# Patient Record
Sex: Male | Born: 1990 | Race: Black or African American | Hispanic: No | Marital: Single | Smoking: Current some day smoker
Health system: Southern US, Community
[De-identification: ages and names within clinical notes are randomized; demographics above are authoritative.]

## PROBLEM LIST (undated history)

## (undated) HISTORY — PX: SHOULDER SURGERY: SHX246

## (undated) HISTORY — PX: MANDIBLE FRACTURE SURGERY: SHX706

---

## 2016-08-22 ENCOUNTER — Emergency Department (HOSPITAL_COMMUNITY)
Admission: EM | Admit: 2016-08-22 | Discharge: 2016-08-23 | Disposition: A | Discharge status: Home / Self Care | Payer: Self-pay | Attending: Emergency Medicine | Admitting: Emergency Medicine

## 2016-08-22 ENCOUNTER — Encounter (HOSPITAL_COMMUNITY): Payer: Self-pay

## 2016-08-22 DIAGNOSIS — R200 Anesthesia of skin: Secondary | ICD-10-CM | POA: Insufficient documentation

## 2016-08-22 DIAGNOSIS — Y929 Unspecified place or not applicable: Secondary | ICD-10-CM | POA: Insufficient documentation

## 2016-08-22 DIAGNOSIS — Y939 Activity, unspecified: Secondary | ICD-10-CM | POA: Insufficient documentation

## 2016-08-22 DIAGNOSIS — Y999 Unspecified external cause status: Secondary | ICD-10-CM | POA: Insufficient documentation

## 2016-08-22 DIAGNOSIS — S0502XA Injury of conjunctiva and corneal abrasion without foreign body, left eye, initial encounter: Secondary | ICD-10-CM | POA: Insufficient documentation

## 2016-08-22 DIAGNOSIS — F17290 Nicotine dependence, other tobacco product, uncomplicated: Secondary | ICD-10-CM | POA: Insufficient documentation

## 2016-08-22 DIAGNOSIS — H1132 Conjunctival hemorrhage, left eye: Secondary | ICD-10-CM | POA: Insufficient documentation

## 2016-08-22 DIAGNOSIS — W500XXA Accidental hit or strike by another person, initial encounter: Secondary | ICD-10-CM | POA: Insufficient documentation

## 2016-08-22 DIAGNOSIS — Y9367 Activity, basketball: Secondary | ICD-10-CM | POA: Insufficient documentation

## 2016-08-22 MED ORDER — FLUORESCEIN SODIUM 0.6 MG OP STRP
1.0000 | ORAL_STRIP | Freq: Once | OPHTHALMIC | Status: AC
  Administered 2016-08-22: 1 via OPHTHALMIC
  Filled 2016-08-22: qty 1

## 2016-08-22 MED ORDER — TETRACAINE HCL 0.5 % OP SOLN
2.0000 [drp] | Freq: Once | OPHTHALMIC | Status: AC
  Administered 2016-08-22: 2 [drp] via OPHTHALMIC
  Filled 2016-08-22: qty 2

## 2016-08-22 NOTE — ED Notes (Deleted)
Pt treated and DC instructions provided by Dr Cathey Endowbowen and walked to the lobby. EDP aware

## 2016-08-22 NOTE — ED Provider Notes (Deleted)
[facility] to be seen by ophthalmology.  Dr. Cathey EndowBowen of Ophthalmology escorted patient in and out of ED.  See [facility] EDP note for H&P for this encounter.  The patient was not interviewed or examined by me.     Roxy Horsemanobert Roquel Burgin, PA-C 08/22/16 2205

## 2016-08-22 NOTE — ED Provider Notes (Addendum)
[facility]-EMERGENCY DEPT Provider Note   CSN: [id number] Arrival date & time: 08/22/16  2020  By signing my name below, I, Rosario Adie, attest that this documentation has been prepared under the direction and in the presence of Derwood Kaplan, MD. Electronically Signed: Rosario Adie, ED Scribe. 08/22/16. 11:39 PM.  History   Chief Complaint Chief Complaint  Patient presents with  . Eye Injury   The history is provided by the patient. No language interpreter was used.    HPI Comments: Brent Buckley is a 26 y.o. male with no pertinent PMHx, who presents to the Emergency Department complaining of sudden onset, intermittent left eye pain s/p injury to the eye which occurred three nights ago. Per pt, he was playing basket ball three nights ago when another players finger struck the globe of his left eye, sustaining his pain. Pt reports associated worsening left eye redness, improving swelling, bruising, and worsening numbness to the area below his left eye, including the upper teeth, nose, and cheek. His eye pain is exacerbated with gazing toward to the right and downwards. He describes his pain as stabbing with these movements. Pt is not currently on anticoagulant or antiplatelet therapy. He denies visual disturbance, photophobia, or any other associated symptoms.   History reviewed. No pertinent past medical history.  There are no active problems to display for this patient.  Past Surgical History:  Procedure Laterality Date  . MANDIBLE FRACTURE SURGERY    . SHOULDER SURGERY      Home Medications    Prior to Admission medications   Not on File   Family History History reviewed. No pertinent family history.  Social History Social History  Substance Use Topics  . Smoking status: Current Some Day Smoker    Types: Cigars  . Smokeless tobacco: Never Used  . Alcohol use Yes     Comment: every weekend   Allergies   Patient has no known allergies.  Review of  Systems Review of Systems A complete 10 system review of systems was obtained and all systems are negative except as noted in the HPI and PMH.   Physical Exam Updated Vital Signs BP 124/67 (BP Location: Right Arm)   Pulse 60   Temp 98.5 F (36.9 C) (Oral)   Resp 16   Ht 6' (1.829 m)   Wt 195 lb (88.5 kg)   SpO2 99%   BMI 26.45 kg/m   Physical Exam  Constitutional: He appears well-developed and well-nourished. No distress.  HENT:  Head: Normocephalic and atraumatic.  Eyes: EOM are normal. Pupils are equal, round, and reactive to light.  EOMs intact. Pt has severe discomfort with right sided gaze and looking upwards on the left eye. Pt has mild discomfort with left sided gaze and looking downward on the L eye.   Pt has a periorbital ecchymosis and edema.  Pt has a significant chemosis on the left conjunctiva of the left eye. No FBs visualized around the left eye and eye lids.  EOMI, PERRL No photophobia - direct or consensual. Gross bedside visual acuity via snellen chart reveals no significant visual deficits. Eye lid eversion reveals no foreign body Fluorecin test under woods lamp reveals dye uptake between 4'o clock and 6 o;clock region. There is a small amount of hazy dye uptake between 4 o clock and 7 o clock. Slit lamp exam reveals no hyphema. Tonopen reveals pressures of the eye at 11, 15 and 16.    Neck: Normal range of motion.  Cardiovascular:  Normal rate.   Pulmonary/Chest: Effort normal.  Abdominal: He exhibits no distension.  Musculoskeletal: Normal range of motion.  Neurological: He is alert.  Pt has numbness to the left maxilla which is more pronounced compared to the contralateral side. He also has numbness over the left forehead. Pt is able to discriminate between sharp and dull over the left maxillary region, however he is having pronounced subjective numbness.   Skin: No pallor.  Psychiatric: He has a normal mood and affect. His behavior is normal.    Nursing note and vitals reviewed.  ED Treatments / Results  DIAGNOSTIC STUDIES: Oxygen Saturation is 96% on RA, normal by my interpretation.   COORDINATION OF CARE: 11:38 PM-Discussed next steps with pt including CT Maxillofacial and f/u w/ Opthalmology. Pt verbalized understanding and is agreeable with the plan.   Labs (all labs ordered are listed, but only abnormal results are displayed) Labs Reviewed - No data to display  EKG  EKG Interpretation None      Radiology No results found.  Procedures Procedures   Medications Ordered in ED Medications  erythromycin ophthalmic ointment 1 application (not administered)  tetracaine (PONTOCAINE) 0.5 % ophthalmic solution 2 drop (2 drops Left Eye Given 08/22/16 2355)  fluorescein ophthalmic strip 1 strip (1 strip Left Eye Given 08/22/16 2355)    Initial Impression / Assessment and Plan / ED Course  I have reviewed the triage vital signs and the nursing notes.  Pertinent labs & imaging results that were available during my care of the patient were reviewed by me and considered in my medical decision making (see chart for details).    Pt comes in with cc of eye injury. He had sustained a traumatic injury 3 days ago whilst playing basketball. Pt has L sided facial numbness. He also has tenderness with eye gaze and a red eye. No hyphema seen. Vision mostly undisturbed.  Clinical concerns for traumatic uveitis, conjunctival abrasion, nerve entrapment due to facial fracture.. I am not sure why he has numbness to the midface from eye injury, but it's likely going to be due to v2 being affected - CT fact to ensure no severe fractures.  Pt's exam r/o uveitis or retinal detachment. eye pressures are normal.   Final Clinical Impressions(s) / ED Diagnoses   Final diagnoses:  Abrasion of left cornea, initial encounter  Subconjunctival hemorrhage of left eye  Left facial numbness   New Prescriptions New Prescriptions   No  medications on file   I personally performed the services described in this documentation, which was scribed in my presence. The recorded information has been reviewed and is accurate.     Derwood KaplanAnkit Lexxie Winberg, MD 08/22/16 [id number]    Derwood KaplanAnkit Hollyanne Schloesser, MD 08/23/16 0110

## 2016-08-22 NOTE — ED Triage Notes (Addendum)
Pt endorses being hit in the face while playing basketball 3 days ago. Pt complains of left eye pain and facial pain and began having left facial numbness today. VSS. Denies blurred vision and able to track with eye. Redness noted to left eye pain. Neuro exam normal.

## 2016-08-23 ENCOUNTER — Emergency Department (HOSPITAL_COMMUNITY): Payer: Self-pay

## 2016-08-23 MED ORDER — ERYTHROMYCIN 5 MG/GM OP OINT: TOPICAL_OINTMENT | OPHTHALMIC | 0 refills | Status: DC

## 2016-08-23 MED ORDER — ERYTHROMYCIN 5 MG/GM OP OINT
1.0000 "application " | TOPICAL_OINTMENT | Freq: Once | OPHTHALMIC | Status: AC
  Administered 2016-08-23: 1 via OPHTHALMIC
  Filled 2016-08-23: qty 3.5

## 2016-08-23 NOTE — Discharge Instructions (Signed)
You have a corneal abrasion. Please use the antibiotics prescribed. Please see the EYE specialist as requested. The numbness to the face - we are sure it's related to your injury, but unsure why. The nerve of the face is probably getting irritated. If it doesn't get better in 2 weeks - see the neurologist.

## 2016-08-23 NOTE — ED Notes (Signed)
Patient transported to CT 

## 2016-12-16 ENCOUNTER — Emergency Department (HOSPITAL_COMMUNITY)
Admission: EM | Admit: 2016-12-16 | Discharge: 2016-12-16 | Discharge status: Against Medical Advice | Payer: Medicaid Other

## 2016-12-16 NOTE — ED Notes (Signed)
Pt called for triage x3 without answer 

## 2016-12-16 NOTE — ED Triage Notes (Signed)
Pt comes from home via EMS with complaints of anxiety that began today. States he has had episodes like this before but has never been diagnosed with anxiety.  Pt states he has been on a drinking binge the past couple of days.  Ambulatory to triage. Vitals WNL.  CBG 112.

## 2016-12-17 ENCOUNTER — Emergency Department (HOSPITAL_COMMUNITY)
Admission: EM | Admit: 2016-12-17 | Discharge: 2016-12-18 | Disposition: A | Discharge status: Home / Self Care | Payer: Medicaid Other | Attending: Emergency Medicine | Admitting: Emergency Medicine

## 2016-12-17 ENCOUNTER — Encounter (HOSPITAL_COMMUNITY): Payer: Self-pay

## 2016-12-17 DIAGNOSIS — F1010 Alcohol abuse, uncomplicated: Secondary | ICD-10-CM | POA: Insufficient documentation

## 2016-12-17 DIAGNOSIS — F17290 Nicotine dependence, other tobacco product, uncomplicated: Secondary | ICD-10-CM | POA: Insufficient documentation

## 2016-12-17 NOTE — ED Triage Notes (Signed)
Pt was seen yesterday for same. He is complaining of chest heaviness and insomnia. He also states that he thinks he is dehydrated from binge drinking. Ambulatory. A&Ox4.

## 2016-12-17 NOTE — ED Notes (Signed)
Pt c/o dehydration and chest congestion that is preventing him from getting a full breath. Pt reports 12 drinks/day for about a month and is feeling dehydrated, but able to keep down fluids.

## 2016-12-17 NOTE — ED Provider Notes (Signed)
[facility]-EMERGENCY DEPT Provider Note   CSN: [id number] Arrival date & time: 12/17/16  1951     History   Chief Complaint Chief Complaint  Patient presents with  . Anxiety    HPI Brent Buckley is a 26 y.o. male.  HPI   Brent Buckley is a 26 y.o. male, patient with no pertinent past medical history, presenting to the ED Requesting resources for alcohol detox. Patient states he drinks 10-12 shots of liquor a day. He has been doing this for at least the last 6 months. He states it is starting to scare him and he wants to quit. He has not had any alcohol for the last 2-3 days. He endorses occasional tremors and insomnia. He uses marijuana, but denies other drug use. He does not want to discuss what made him start drinking heavily. He denies SI/HI. Denies A/V hallucinations. He denies N/V/D, consistent tremors, diaphoresis, shortness of breath, confusion, seizures, or any other complaints.    History reviewed. No pertinent past medical history.  There are no active problems to display for this patient.   Past Surgical History:  Procedure Laterality Date  . MANDIBLE FRACTURE SURGERY    . SHOULDER SURGERY         Home Medications    Prior to Admission medications   Medication Sig Start Date End Date Taking? Authorizing Provider  chlordiazePOXIDE (LIBRIUM) 25 MG capsule 50mg  PO TID x 1D, then 25-50mg  PO BID X 1D, then 25-50mg  PO QD X 1D 12/18/16   Ijanae Macapagal C, PA-C  erythromycin ophthalmic ointment Place a 1/2 inch ribbon of ointment into the lower eyelid. 08/23/16   Derwood KaplanNanavati, Ankit, MD  hydrOXYzine (ATARAX/VISTARIL) 25 MG tablet Take 1 tablet (25 mg total) by mouth every 6 (six) hours. 12/18/16   Terez Freimark C, PA-C  ondansetron (ZOFRAN ODT) 4 MG disintegrating tablet Take 1 tablet (4 mg total) by mouth every 8 (eight) hours as needed for nausea or vomiting. 12/18/16   Espn Zeman, Hillard DankerShawn C, PA-C    Family History History reviewed. No pertinent family history.  Social History Social  History  Substance Use Topics  . Smoking status: Current Some Day Smoker    Types: Cigars  . Smokeless tobacco: Never Used  . Alcohol use Yes     Comment: every weekend     Allergies   Patient has no known allergies.   Review of Systems Review of Systems  Constitutional: Negative for chills and fever.       Request for alcohol detox  Respiratory: Negative for shortness of breath.   Cardiovascular: Negative for chest pain.  Gastrointestinal: Negative for abdominal pain, diarrhea, nausea and vomiting.  Neurological: Positive for tremors (occasional, minor). Negative for dizziness, seizures, syncope, weakness, light-headedness, numbness and headaches.  All other systems reviewed and are negative.    Physical Exam Updated Vital Signs BP (!) 129/93 (BP Location: Left Arm)   Pulse 67   Temp 99 F (37.2 C) (Oral)   Resp 16   Ht 6' (1.829 m)   Wt 86.3 kg (190 lb 4.8 oz)   SpO2 98%   BMI 25.81 kg/m   Physical Exam  Constitutional: He is oriented to person, place, and time. He appears well-developed and well-nourished. No distress.  Patient is well-appearing, engaged, and makes excellent eye contact throughout the conversation. He does not appear anxious or uncomfortable.  HENT:  Head: Normocephalic and atraumatic.  Mouth/Throat: Oropharynx is clear and moist.  Eyes: Conjunctivae and EOM are normal. Pupils are equal,  round, and reactive to light.  Neck: Neck supple.  Cardiovascular: Normal rate, regular rhythm, normal heart sounds and intact distal pulses.   Pulmonary/Chest: Effort normal and breath sounds normal. No respiratory distress.  Abdominal: Soft. There is no tenderness. There is no guarding.  Musculoskeletal: He exhibits no edema.  Patient has no signs of tremor, either at rest or with intention.  Lymphadenopathy:    He has no cervical adenopathy.  Neurological: He is alert and oriented to person, place, and time.  No sensory deficits. Strength 5/5 in all  extremities. No gait disturbance. Coordination intact including heel to shin and finger to nose. Cranial nerves III-XII grossly intact. No facial droop.   Skin: Skin is warm and dry. He is not diaphoretic.  Psychiatric: He has a normal mood and affect. His behavior is normal.  Nursing note and vitals reviewed.    ED Treatments / Results  Labs (all labs ordered are listed, but only abnormal results are displayed) Labs Reviewed - No data to display  EKG  EKG Interpretation None       Radiology No results found.  Procedures Procedures (including critical care time)  Medications Ordered in ED Medications - No data to display   Initial Impression / Assessment and Plan / ED Course  I have reviewed the triage vital signs and the nursing notes.  Pertinent labs & imaging results that were available during my care of the patient were reviewed by me and considered in my medical decision making (see chart for details).     Patient presents requesting alcohol detox. He shows no clinical signs of acute withdrawal. Outpatient resources and medication regimens for withdrawal symptoms discussed. Strict return precautions also discussed. Patient voices understanding, accepts the plan, and is comfortable with discharge.  Vitals:   12/17/16 2021 12/18/16 0038  BP: (!) 129/93 139/84  Pulse: 67 (!) 54  Resp: 16 14  Temp: 99 F (37.2 C)   TempSrc: Oral   SpO2: 98% 100%  Weight: 86.3 kg (190 lb 4.8 oz)   Height: 6' (1.829 m)      Final Clinical Impressions(s) / ED Diagnoses   Final diagnoses:  Alcohol abuse    New Prescriptions Discharge Medication List as of 12/18/2016 12:32 AM    START taking these medications   Details  chlordiazePOXIDE (LIBRIUM) 25 MG capsule 50mg  PO TID x 1D, then 25-50mg  PO BID X 1D, then 25-50mg  PO QD X 1D, Print    hydrOXYzine (ATARAX/VISTARIL) 25 MG tablet Take 1 tablet (25 mg total) by mouth every 6 (six) hours., Starting Fri 12/18/2016, Print      ondansetron (ZOFRAN ODT) 4 MG disintegrating tablet Take 1 tablet (4 mg total) by mouth every 8 (eight) hours as needed for nausea or vomiting., Starting Fri 12/18/2016, Print         Shanen Norris, [place] C, PA-C 12/18/16 1027    Alvira Monday, MD 12/18/16 1427

## 2016-12-18 MED ORDER — CHLORDIAZEPOXIDE HCL 25 MG PO CAPS: ORAL_CAPSULE | ORAL | 0 refills | Status: DC

## 2016-12-18 MED ORDER — HYDROXYZINE HCL 25 MG PO TABS: 25.0000 mg | ORAL_TABLET | Freq: Four times a day (QID) | ORAL | 0 refills | Status: DC

## 2016-12-18 MED ORDER — ONDANSETRON 4 MG PO TBDP: 4.0000 mg | ORAL_TABLET | Freq: Three times a day (TID) | ORAL | 0 refills | Status: DC | PRN

## 2016-12-18 NOTE — Discharge Instructions (Signed)
You have been seen tonight asking for assistance to quit drinking alcohol. This is a long and tedious road for most people. It requires a lot of time, commitment, and support. Refer to the attached documents for further information. Please seek out some of the resources listed. Initiate the librium taper to help with some of the minor symptoms of alcohol withdrawal. Zofran for nausea. Stay well hydrated. May try Benadryl or melatonin to help with sleep. Hydroxyzine may help with anxiety or may also help with sleep. Choose only one of the sleep assistance medications at a time. Do not take a combination. Return to the ED should you begin to have symptoms of severe alcohol withdrawal, such as uncontrolled tremors, continuous vomiting, confusion, seizures, or any other serious complaints.

## 2018-07-25 ENCOUNTER — Emergency Department (HOSPITAL_COMMUNITY)
Admission: EM | Admit: 2018-07-25 | Discharge: 2018-07-25 | Disposition: A | Discharge status: Home / Self Care | Payer: Self-pay | Attending: Emergency Medicine | Admitting: Emergency Medicine

## 2018-07-25 ENCOUNTER — Encounter (HOSPITAL_COMMUNITY): Payer: Self-pay | Admitting: *Deleted

## 2018-07-25 DIAGNOSIS — R52 Pain, unspecified: Secondary | ICD-10-CM | POA: Insufficient documentation

## 2018-07-25 DIAGNOSIS — Z5321 Procedure and treatment not carried out due to patient leaving prior to being seen by health care provider: Secondary | ICD-10-CM | POA: Insufficient documentation

## 2018-07-25 NOTE — ED Notes (Signed)
Called pt to be move to room no answer

## 2018-07-25 NOTE — ED Notes (Signed)
Called pt. For room, no answer. Will try again

## 2018-07-25 NOTE — ED Notes (Signed)
No answer for room x3 

## 2018-07-25 NOTE — ED Triage Notes (Signed)
Pt in c/o fatigue and body aches, denies n/v, reports he just feels dehydrated, no distress noted

## 2018-07-25 NOTE — ED Notes (Signed)
Called pt x2 for vitals, no response. °

## 2018-12-25 ENCOUNTER — Other Ambulatory Visit: Payer: Self-pay

## 2018-12-25 ENCOUNTER — Emergency Department (HOSPITAL_COMMUNITY)
Admission: EM | Admit: 2018-12-25 | Discharge: 2018-12-26 | Disposition: A | Discharge status: Home / Self Care | Payer: Medicaid Other | Attending: Emergency Medicine | Admitting: Emergency Medicine

## 2018-12-25 ENCOUNTER — Emergency Department (HOSPITAL_COMMUNITY): Payer: Medicaid Other

## 2018-12-25 ENCOUNTER — Encounter (HOSPITAL_COMMUNITY): Payer: Self-pay

## 2018-12-25 DIAGNOSIS — I493 Ventricular premature depolarization: Secondary | ICD-10-CM | POA: Insufficient documentation

## 2018-12-25 DIAGNOSIS — Z79899 Other long term (current) drug therapy: Secondary | ICD-10-CM | POA: Insufficient documentation

## 2018-12-25 DIAGNOSIS — R079 Chest pain, unspecified: Secondary | ICD-10-CM

## 2018-12-25 DIAGNOSIS — E876 Hypokalemia: Secondary | ICD-10-CM | POA: Insufficient documentation

## 2018-12-25 DIAGNOSIS — R0789 Other chest pain: Secondary | ICD-10-CM | POA: Insufficient documentation

## 2018-12-25 DIAGNOSIS — R55 Syncope and collapse: Secondary | ICD-10-CM

## 2018-12-25 DIAGNOSIS — F17290 Nicotine dependence, other tobacco product, uncomplicated: Secondary | ICD-10-CM | POA: Insufficient documentation

## 2018-12-25 LAB — URINALYSIS, ROUTINE W REFLEX MICROSCOPIC
Bacteria, UA: NONE SEEN
Bilirubin Urine: NEGATIVE
Glucose, UA: NEGATIVE mg/dL
Hgb urine dipstick: NEGATIVE
Ketones, ur: 5 mg/dL — AB
Leukocytes,Ua: NEGATIVE
Nitrite: NEGATIVE
Protein, ur: 100 mg/dL — AB
Specific Gravity, Urine: 1.026 (ref 1.005–1.030)
pH: 8 (ref 5.0–8.0)

## 2018-12-25 LAB — CBC
HCT: 41.6 % (ref 39.0–52.0)
Hemoglobin: 14.1 g/dL (ref 13.0–17.0)
MCH: 32.2 pg (ref 26.0–34.0)
MCHC: 33.9 g/dL (ref 30.0–36.0)
MCV: 95 fL (ref 80.0–100.0)
Platelets: 238 10*3/uL (ref 150–400)
RBC: 4.38 MIL/uL (ref 4.22–5.81)
RDW: 13.9 % (ref 11.5–15.5)
WBC: 13.2 10*3/uL — ABNORMAL HIGH (ref 4.0–10.5)
nRBC: 0 % (ref 0.0–0.2)

## 2018-12-25 LAB — BASIC METABOLIC PANEL
Anion gap: 13 (ref 5–15)
BUN: 6 mg/dL (ref 6–20)
CO2: 21 mmol/L — ABNORMAL LOW (ref 22–32)
Calcium: 9.4 mg/dL (ref 8.9–10.3)
Chloride: 106 mmol/L (ref 98–111)
Creatinine, Ser: 1.07 mg/dL (ref 0.61–1.24)
GFR calc Af Amer: >60 mL/min (ref >60)
GFR calc non Af Amer: >60 mL/min (ref >60)
Glucose, Bld: 132 mg/dL — ABNORMAL HIGH (ref 70–99)
Potassium: 3 mmol/L — ABNORMAL LOW (ref 3.5–5.1)
Sodium: 140 mmol/L (ref 135–145)

## 2018-12-25 LAB — CBG MONITORING, ED: Glucose-Capillary: 133 mg/dL — ABNORMAL HIGH (ref 70–99)

## 2018-12-25 MED ORDER — POTASSIUM CHLORIDE CRYS ER 20 MEQ PO TBCR
40.0000 meq | EXTENDED_RELEASE_TABLET | Freq: Once | ORAL | Status: AC
  Administered 2018-12-26: 40 meq via ORAL
  Filled 2018-12-25: qty 2

## 2018-12-25 MED ORDER — SODIUM CHLORIDE 0.9% FLUSH: 3.0000 mL | Freq: Once | INTRAVENOUS | Status: DC

## 2018-12-25 MED ORDER — SODIUM CHLORIDE 0.9 % IV BOLUS (SEPSIS)
1000.0000 mL | Freq: Once | INTRAVENOUS | Status: AC
  Administered 2018-12-26: 02:00:00 1000 mL via INTRAVENOUS

## 2018-12-25 NOTE — ED Triage Notes (Signed)
Pt reports chest tightness and feeling lightheaded to where he did have a syncopal episode. Pt also reports he feel shaky and SOB. nad noted

## 2018-12-25 NOTE — ED Provider Notes (Signed)
TIME SEEN: 11:15 PM  CHIEF COMPLAINT: Syncope, palpitations  HPI: Patient is a [age] year old male with a previous history of alcohol abuse who presents to the emergency department after a syncopal event that occurred around 8 PM tonight.  States that he had been drinking heavily for the past 3 days.  States that today he started having palpitations like his heart was skipping a beat with some intermittent chest tightness, shortness of breath.  States after walking up a flight of stairs he got very lightheaded and felt like "my chest was getting smaller" and had a syncopal event.  States he had sat down on the stairs before passing out and his friends caught him before he fell and hit his head.  He did not have any witnessed seizure activity.  States he is feeling better but still having some palpitations.  He denies family history of CAD.  No history of sudden cardiac death.  He has been able to exert himself and play sports before without symptoms.  He states he exercises regularly and never has these symptoms.  He denies any drug use.  No history of PE, DVT, exogenous estrogen use, recent fractures, surgery, trauma, hospitalization or prolonged travel. No lower extremity swelling or pain. No calf tenderness.  He denies fevers or cough.  No vomiting or diarrhea.  ROS: See HPI Constitutional: no fever  Eyes: no drainage  ENT: no runny nose   Cardiovascular:  chest pain  Resp: SOB  GI: no vomiting GU: no dysuria Integumentary: no rash  Allergy: no hives  Musculoskeletal: no leg swelling  Neurological: no slurred speech ROS otherwise negative  PAST MEDICAL HISTORY/PAST SURGICAL HISTORY:  History reviewed. No pertinent past medical history.  MEDICATIONS:  Prior to Admission medications   Medication Sig Start Date End Date Taking? Authorizing Provider  chlordiazePOXIDE (LIBRIUM) 25 MG capsule 50mg  PO TID x 1D, then 25-50mg  PO BID X 1D, then 25-50mg  PO QD X 1D 12/18/16   Joy, Shawn C, PA-C   erythromycin ophthalmic ointment Place a 1/2 inch ribbon of ointment into the lower eyelid. 08/23/16   Derwood KaplanNanavati, Ankit, MD  hydrOXYzine (ATARAX/VISTARIL) 25 MG tablet Take 1 tablet (25 mg total) by mouth every 6 (six) hours. 12/18/16   Joy, Shawn C, PA-C  ondansetron (ZOFRAN ODT) 4 MG disintegrating tablet Take 1 tablet (4 mg total) by mouth every 8 (eight) hours as needed for nausea or vomiting. 12/18/16   Joy, Shawn C, PA-C    ALLERGIES:  No Known Allergies  SOCIAL HISTORY:  Social History   Tobacco Use  . Smoking status: Current Some Day Smoker    Types: Cigars  . Smokeless tobacco: Never Used  Substance Use Topics  . Alcohol use: Yes    Comment: every weekend    FAMILY HISTORY: No family history on file.  EXAM: BP 135/89 (BP Location: Right Arm)   Pulse 69   Temp 97.9 F (36.6 C) (Oral)   Resp 16   SpO2 99%  CONSTITUTIONAL: Alert and oriented and responds appropriately to questions. Well-appearing; well-nourished HEAD: Normocephalic EYES: Conjunctivae clear, pupils appear equal, EOMI ENT: normal nose; moist mucous membranes NECK: Supple, no meningismus, no nuchal rigidity, no LAD  CARD: RRR; S1 and S2 appreciated; no murmurs, no clicks, no rubs, no gallops, frequent PVCs noted on the cardiac monitor RESP: Normal chest excursion without splinting or tachypnea; breath sounds clear and equal bilaterally; no wheezes, no rhonchi, no rales, no hypoxia or respiratory distress, speaking full sentences ABD/GI: Normal bowel  sounds; non-distended; soft, non-tender, no rebound, no guarding, no peritoneal signs, no hepatosplenomegaly BACK:  The back appears normal and is non-tender to palpation, there is no CVA tenderness EXT: Normal ROM in all joints; non-tender to palpation; no edema; normal capillary refill; no cyanosis, no calf tenderness or swelling    SKIN: Normal color for age and race; warm; no rash NEURO: Moves all extremities equally PSYCH: The patient's mood and manner are  appropriate. Grooming and personal hygiene are appropriate.  MEDICAL DECISION MAKING: Patient here with palpitations, chest pain and shortness of breath.  Patient is PERC negative.  No family history of sudden cardiac death.  He is able to exert himself normally without symptoms.  He has no murmur on exam.  No signs of volume overload.  Low suspicion for ACS.  Suspect that his symptoms are likely related to dehydration and electrolyte derangements from heavy alcohol use recently.  Will give IV fluids.  His potassium level here is 3.0.  Will give oral replacement.  Will check magnesium level.  ED PROGRESS: Patient's chest x-ray is clear.  Troponin is negative.  Magnesium level is 1.6.  Given IV replacement.  He has a mild leukocytosis which is likely reactive.  No infectious symptoms.  Normal TSH.  Initial EKG shows ventricular bigeminy which has improved.  Second EKG shows occasional PVCs.  Now on the cardiac monitor he does not appear to have any more PVCs.  No other arrhythmias seen on cardiac monitoring.  He states he is feeling better.  I have recommended abstaining from alcohol.  Will discharge home with potassium replacement for the next several days.  Recommend increase fluid intake at home.  Discussed return precautions and provided with outpatient PCP follow-up.   At this time, I do not feel there is any life-threatening condition present. I have reviewed and discussed all results (EKG, imaging, lab, urine as appropriate) and exam findings with patient/family. I have reviewed nursing notes and appropriate previous records.  I feel the patient is safe to be discharged home without further emergent workup and can continue workup as an outpatient as needed. Discussed usual and customary return precautions. Patient/family verbalize understanding and are comfortable with this plan.  Outpatient follow-up has been provided as needed. All questions have been answered.     EKG  Interpretation  Date/Time:  Sunday December 25 2018 22:27:00 EDT Ventricular Rate:  68 PR Interval:    QRS Duration: 109 QT Interval:  417 QTC Calculation: 444 R Axis:   76 Text Interpretation:  Sinus arrhythmia Bigeminy has resolved Confirmed by Pryor Curia [phone]) on 12/25/2018 11:02:27 PM          Marzell Allemand, Delice Bison, DO 12/26/18 7989

## 2018-12-26 LAB — RAPID URINE DRUG SCREEN, HOSP PERFORMED
Amphetamines: NOT DETECTED
Barbiturates: NOT DETECTED
Benzodiazepines: NOT DETECTED
Cocaine: NOT DETECTED
Opiates: NOT DETECTED
Tetrahydrocannabinol: POSITIVE — AB

## 2018-12-26 LAB — TSH: TSH: 0.866 u[IU]/mL (ref 0.350–4.500)

## 2018-12-26 LAB — MAGNESIUM: Magnesium: 1.6 mg/dL — ABNORMAL LOW (ref 1.7–2.4)

## 2018-12-26 LAB — TROPONIN I (HIGH SENSITIVITY): Troponin I (High Sensitivity): 4 ng/L (ref <18)

## 2018-12-26 MED ORDER — POTASSIUM CHLORIDE CRYS ER 20 MEQ PO TBCR: 20.0000 meq | EXTENDED_RELEASE_TABLET | Freq: Two times a day (BID) | ORAL | 0 refills | Status: DC

## 2018-12-26 MED ORDER — MAGNESIUM SULFATE 2 GM/50ML IV SOLN
2.0000 g | Freq: Once | INTRAVENOUS | Status: AC
  Administered 2018-12-26: 2 g via INTRAVENOUS
  Filled 2018-12-26: qty 50

## 2018-12-26 NOTE — Discharge Instructions (Signed)
Steps to find a Primary Care Provider (PCP): ° °Call [phone] or [phone] to access "[facility] Find a Doctor Service." ° °2.  You may also go on the [facility] website at [email or web address] ° °3.  [facility] also frequently accepts new patients. ° ° [facility]  °[address] °[place] [address] °[phone] ° °4.  There are also multiple [facility], [facility] and [facility] practices throughout the [place] that are frequently accepting new patients. You may find a clinic that is close to your home and contact them. ° °[facility] °[email or web address] °[phone] ° °[facility] °[email or web address] ° °[facility] °[email or web address] °[phone] ° °[facility] °[email or web address] °[phone] ° °5.  Local Health Departments also can provide primary care services. ° °[facility]  °[address] °[place] [address] °[phone] ° °[facility] °[address] °[place] [address] °[phone] ° °[facility] °[address]  °[place] [address] °[phone] ° ° °

## 2019-04-17 ENCOUNTER — Ambulatory Visit (HOSPITAL_COMMUNITY)
Admission: EM | Admit: 2019-04-17 | Discharge: 2019-04-17 | Disposition: A | Discharge status: Home / Self Care | Payer: Medicaid Other | Attending: Family Medicine | Admitting: Family Medicine

## 2019-04-17 ENCOUNTER — Other Ambulatory Visit: Payer: Self-pay

## 2019-04-17 ENCOUNTER — Encounter (HOSPITAL_COMMUNITY): Payer: Self-pay

## 2019-04-17 DIAGNOSIS — R369 Urethral discharge, unspecified: Secondary | ICD-10-CM | POA: Insufficient documentation

## 2019-04-17 LAB — HIV ANTIBODY (ROUTINE TESTING W REFLEX): HIV Screen 4th Generation wRfx: NONREACTIVE

## 2019-04-17 MED ORDER — CEFTRIAXONE SODIUM 250 MG IJ SOLR
INTRAMUSCULAR | Status: AC
  Filled 2019-04-17: qty 250

## 2019-04-17 MED ORDER — AZITHROMYCIN 250 MG PO TABS
ORAL_TABLET | ORAL | Status: AC
  Filled 2019-04-17: qty 4

## 2019-04-17 MED ORDER — CEFTRIAXONE SODIUM 250 MG IJ SOLR
250.0000 mg | Freq: Once | INTRAMUSCULAR | Status: AC
  Administered 2019-04-17: 12:00:00 250 mg via INTRAMUSCULAR

## 2019-04-17 MED ORDER — LIDOCAINE HCL (PF) 1 % IJ SOLN
INTRAMUSCULAR | Status: AC
  Filled 2019-04-17: qty 2

## 2019-04-17 MED ORDER — AZITHROMYCIN 250 MG PO TABS
1000.0000 mg | ORAL_TABLET | Freq: Once | ORAL | Status: AC
  Administered 2019-04-17: 12:00:00 1000 mg via ORAL

## 2019-04-17 NOTE — ED Provider Notes (Signed)
[place]    CSN: [id number] Arrival date & time: 04/17/19  1029      History   Chief Complaint Chief Complaint  Patient presents with  . SEXUALLY TRANSMITTED DISEASE    HPI Brent Buckley is a 28 y.o. male.   HPI  Patient has a penile discharge since this morning.  He called his friend and was notified that he had been exposed to chlamydia and gonorrhea.  He is here for treatment.  He states he wants to be tested "for everything".  No fever chills.  No rash.  No abdominal pain  History reviewed. No pertinent past medical history.  There are no active problems to display for this patient.   Past Surgical History:  Procedure Laterality Date  . MANDIBLE FRACTURE SURGERY    . SHOULDER SURGERY         Home Medications    Prior to Admission medications   Medication Sig Start Date End Date Taking? Authorizing Provider  potassium chloride SA (K-DUR) 20 MEQ tablet Take 1 tablet (20 mEq total) by mouth 2 (two) times daily. 12/26/18 04/17/19  Ward, Delice Bison, DO    Family History Family History  Problem Relation Age of Onset  . Healthy Mother   . Healthy Father     Social History Social History   Tobacco Use  . Smoking status: Current Some Day Smoker    Types: Cigars  . Smokeless tobacco: Never Used  Substance Use Topics  . Alcohol use: Yes    Comment: every weekend  . Drug use: Yes    Types: Marijuana    Comment: every day     Allergies   Patient has no known allergies.   Review of Systems Review of Systems  Constitutional: Negative for chills and fever.  HENT: Negative for ear pain and sore throat.   Eyes: Negative for pain and visual disturbance.  Respiratory: Negative for cough and shortness of breath.   Cardiovascular: Negative for chest pain and palpitations.  Gastrointestinal: Negative for abdominal pain and vomiting.  Genitourinary: Positive for discharge. Negative for dysuria and hematuria.  Musculoskeletal: Negative for  arthralgias and back pain.  Skin: Negative for color change and rash.  Neurological: Negative for seizures and syncope.  All other systems reviewed and are negative.    Physical Exam Triage Vital Signs ED Triage Vitals  Enc Vitals Group     BP 04/17/19 1127 (!) 151/79     Pulse Rate 04/17/19 1127 64     Resp 04/17/19 1127 16     Temp 04/17/19 1127 98.5 F (36.9 C)     Temp Source 04/17/19 1127 Oral     SpO2 04/17/19 1127 100 %     Weight --      Height --      Head Circumference --      Peak Flow --      Pain Score 04/17/19 1125 2     Pain Loc --      Pain Edu? --      Excl. in [place]? --    No data found.  Updated Vital Signs BP (!) 151/79 (BP Location: Right Arm)   Pulse 64   Temp 98.5 F (36.9 C) (Oral)   Resp 16   SpO2 100%   Visual Acuity Right Eye Distance:   Left Eye Distance:   Bilateral Distance:    Right Eye Near:   Left Eye Near:    Bilateral Near:  Physical Exam Constitutional:      General: He is not in acute distress.    Appearance: He is well-developed.  HENT:     Head: Normocephalic and atraumatic.  Eyes:     Conjunctiva/sclera: Conjunctivae normal.     Pupils: Pupils are equal, round, and reactive to light.  Neck:     Musculoskeletal: Normal range of motion.  Cardiovascular:     Rate and Rhythm: Normal rate.  Pulmonary:     Effort: Pulmonary effort is normal. No respiratory distress.  Abdominal:     General: There is no distension.     Palpations: Abdomen is soft.  Genitourinary:    Penis: Normal.      Comments: Normal circumcised penis.  Copious yellow discharge Musculoskeletal: Normal range of motion.  Skin:    General: Skin is warm and dry.  Neurological:     General: No focal deficit present.     Mental Status: He is alert.  Psychiatric:        Mood and Affect: Mood normal.        Behavior: Behavior normal.      UC Treatments / Results  Labs (all labs ordered are listed, but only abnormal results are displayed)  Labs Reviewed  RPR  HIV ANTIBODY (ROUTINE TESTING W REFLEX)  CYTOLOGY, (ORAL, ANAL, URETHRAL) ANCILLARY ONLY    EKG   Radiology No results found.  Procedures Procedures (including critical care time)  Medications Ordered in UC Medications  azithromycin (ZITHROMAX) tablet 1,000 mg (has no administration in time range)  cefTRIAXone (ROCEPHIN) injection 250 mg (has no administration in time range)    Initial Impression / Assessment and Plan / UC Course  I have reviewed the triage vital signs and the nursing notes.  Pertinent labs & imaging results that were available during my care of the patient were reviewed by me and considered in my medical decision making (see chart for details).    Safe sex discussed Final Clinical Impressions(s) / UC Diagnoses   Final diagnoses:  Penile discharge     Discharge Instructions     We will call you if any tests are positive Avoid sex for 7 days after treatment   ED Prescriptions    None     PDMP not reviewed this encounter.   Eustace Moore, MD 04/17/19 [phone]

## 2019-04-17 NOTE — ED Triage Notes (Signed)
Patient presents to Urgent Care with complaints of needing STD testing since being exposed to gonorrhea and chlamydia. Patient reports he experienced penile discharge this morning.

## 2019-04-17 NOTE — Discharge Instructions (Signed)
We will call you if any tests are positive Avoid sex for 7 days after treatment

## 2019-04-18 LAB — CYTOLOGY, (ORAL, ANAL, URETHRAL) ANCILLARY ONLY
Chlamydia: POSITIVE — AB
Neisseria Gonorrhea: POSITIVE — AB
Trichomonas: NEGATIVE

## 2019-04-18 LAB — RPR: RPR Ser Ql: NONREACTIVE

## 2019-04-21 ENCOUNTER — Telehealth (HOSPITAL_COMMUNITY): Payer: Self-pay | Admitting: Emergency Medicine

## 2019-04-21 NOTE — Telephone Encounter (Signed)
Test for gonorrhea was positive. This was treated at the urgent care visit with IM rocephin 250mg  and po zithromax 1g. Pt needs education to refrain from sexual intercourse for 7 days after treatment to give the medicine time to work. Sexual partners need to be notified and tested/treated. Condoms may reduce risk of reinfection. Recheck or followup with PCP for further evaluation if symptoms are not improving. [facility] notified.   Chlamydia is positive.  This was treated at the urgent care visit with po zithromax 1g.  Pt needs education to please refrain from sexual intercourse for 7 days to give the medicine time to work.  Sexual partners need to be notified and tested/treated.  Condoms may reduce risk of reinfection.  Recheck or followup with PCP for further evaluation if symptoms are not improving.  [facility] notified.  Attempted to reach patient. No answer at this time. No voicemail, call cannot be completed.

## 2019-04-24 ENCOUNTER — Telehealth (HOSPITAL_COMMUNITY): Payer: Self-pay | Admitting: Emergency Medicine

## 2019-04-24 NOTE — Telephone Encounter (Signed)
Attempted to reach patient x2.No working number on file. Letter sent.

## 2021-02-10 ENCOUNTER — Emergency Department (HOSPITAL_COMMUNITY)
Admission: EM | Admit: 2021-02-10 | Discharge: 2021-02-11 | Disposition: A | Discharge status: Home / Self Care | Payer: Medicaid Other | Attending: Emergency Medicine | Admitting: Emergency Medicine

## 2021-02-10 ENCOUNTER — Encounter (HOSPITAL_COMMUNITY): Payer: Self-pay | Admitting: Emergency Medicine

## 2021-02-10 DIAGNOSIS — Z79899 Other long term (current) drug therapy: Secondary | ICD-10-CM | POA: Insufficient documentation

## 2021-02-10 DIAGNOSIS — E876 Hypokalemia: Secondary | ICD-10-CM | POA: Insufficient documentation

## 2021-02-10 DIAGNOSIS — F17290 Nicotine dependence, other tobacco product, uncomplicated: Secondary | ICD-10-CM | POA: Insufficient documentation

## 2021-02-10 DIAGNOSIS — R55 Syncope and collapse: Secondary | ICD-10-CM | POA: Insufficient documentation

## 2021-02-10 DIAGNOSIS — E860 Dehydration: Secondary | ICD-10-CM | POA: Insufficient documentation

## 2021-02-10 DIAGNOSIS — Y900 Blood alcohol level of less than 20 mg/100 ml: Secondary | ICD-10-CM | POA: Insufficient documentation

## 2021-02-10 LAB — CBC WITH DIFFERENTIAL/PLATELET
Abs Immature Granulocytes: 0.06 10*3/uL (ref 0.00–0.07)
Basophils Absolute: 0 10*3/uL (ref 0.0–0.1)
Basophils Relative: 0 %
Eosinophils Absolute: 0.1 10*3/uL (ref 0.0–0.5)
Eosinophils Relative: 0 %
HCT: 46.8 % (ref 39.0–52.0)
Hemoglobin: 15.9 g/dL (ref 13.0–17.0)
Immature Granulocytes: 0 %
Lymphocytes Relative: 11 %
Lymphs Abs: 1.7 10*3/uL (ref 0.7–4.0)
MCH: 32.3 pg (ref 26.0–34.0)
MCHC: 34 g/dL (ref 30.0–36.0)
MCV: 95.1 fL (ref 80.0–100.0)
Monocytes Absolute: 1.2 10*3/uL — ABNORMAL HIGH (ref 0.1–1.0)
Monocytes Relative: 8 %
Neutro Abs: 12.3 10*3/uL — ABNORMAL HIGH (ref 1.7–7.7)
Neutrophils Relative %: 81 %
Platelets: 298 10*3/uL (ref 150–400)
RBC: 4.92 MIL/uL (ref 4.22–5.81)
RDW: 13.9 % (ref 11.5–15.5)
WBC: 15.3 10*3/uL — ABNORMAL HIGH (ref 4.0–10.5)
nRBC: 0 % (ref 0.0–0.2)

## 2021-02-10 LAB — BASIC METABOLIC PANEL
Anion gap: 16 — ABNORMAL HIGH (ref 5–15)
BUN: 10 mg/dL (ref 6–20)
CO2: 23 mmol/L (ref 22–32)
Calcium: 9.8 mg/dL (ref 8.9–10.3)
Chloride: 98 mmol/L (ref 98–111)
Creatinine, Ser: 1.56 mg/dL — ABNORMAL HIGH (ref 0.61–1.24)
GFR, Estimated: >60 mL/min (ref >60)
Glucose, Bld: 98 mg/dL (ref 70–99)
Potassium: 3.2 mmol/L — ABNORMAL LOW (ref 3.5–5.1)
Sodium: 137 mmol/L (ref 135–145)

## 2021-02-10 MED ORDER — ONDANSETRON 4 MG PO TBDP
4.0000 mg | ORAL_TABLET | Freq: Once | ORAL | Status: AC
  Administered 2021-02-10: 4 mg via ORAL
  Filled 2021-02-10: qty 1

## 2021-02-10 NOTE — ED Triage Notes (Signed)
Pt arrive pov from home for c/o increase dizziness since this morning and states he pass out 3 times today pta.

## 2021-02-10 NOTE — ED Provider Notes (Signed)
Emergency Medicine Provider Triage Evaluation Note  Brent Buckley , a 30 y.o. male  was evaluated in triage.  Pt complains of 2 episodes of syncope that occurred earlier today.  Felt lightheaded prior to this.  Thinks he is dehydrated.  States has been playing basketball today.  He states he drank a lot of alcohol last night and then today he has only had 3 tacos all day.  He has had nothing else to eat  Review of Systems  Positive: Syncope Negative: Chest pain  Physical Exam  BP (!) 142/97   Pulse 96   Temp 97.8 F (36.6 C) (Oral)   Resp 16   SpO2 98%  Gen:   Awake, no distress   Resp:  Normal effort  MSK:   Moves extremities without difficulty  Medical Decision Making  Medically screening exam initiated at 8:42 PM.  Appropriate orders placed.  Brent Buckley was informed that the remainder of the evaluation will be completed by another provider, this initial triage assessment does not replace that evaluation, and the importance of remaining in the ED until their evaluation is complete.     Karrie Meres, PA-C 02/10/21 2043    Melene Plan, DO 02/10/21 2255

## 2021-02-11 ENCOUNTER — Other Ambulatory Visit: Payer: Self-pay

## 2021-02-11 LAB — URINALYSIS, ROUTINE W REFLEX MICROSCOPIC
Bilirubin Urine: NEGATIVE
Glucose, UA: NEGATIVE mg/dL
Hgb urine dipstick: NEGATIVE
Ketones, ur: 80 mg/dL — AB
Leukocytes,Ua: NEGATIVE
Nitrite: NEGATIVE
Protein, ur: 30 mg/dL — AB
Specific Gravity, Urine: 1.025 (ref 1.005–1.030)
pH: 6 (ref 5.0–8.0)

## 2021-02-11 LAB — ETHANOL: Alcohol, Ethyl (B): <10 mg/dL (ref <10)

## 2021-02-11 LAB — CK: Total CK: 358 U/L (ref 49–397)

## 2021-02-11 MED ORDER — POTASSIUM CHLORIDE 10 MEQ/100ML IV SOLN
10.0000 meq | Freq: Once | INTRAVENOUS | Status: DC
  Filled 2021-02-11: qty 100

## 2021-02-11 MED ORDER — POTASSIUM CHLORIDE 20 MEQ PO PACK
40.0000 meq | PACK | Freq: Once | ORAL | Status: AC
  Administered 2021-02-11: 40 meq via ORAL
  Filled 2021-02-11: qty 2

## 2021-02-11 MED ORDER — SODIUM CHLORIDE 0.9 % IV BOLUS
1000.0000 mL | Freq: Once | INTRAVENOUS | Status: AC
  Administered 2021-02-11: 1000 mL via INTRAVENOUS

## 2021-02-11 NOTE — ED Notes (Signed)
Pt given ginger ale per EDP's approval

## 2021-02-11 NOTE — ED Provider Notes (Signed)
[facility] EMERGENCY DEPARTMENT Provider Note   CSN: [id number] Arrival date & time: 02/10/21  2031     History Chief Complaint  Patient presents with   Loss of Consciousness    Brent Buckley is a 30 y.o. male who presents to the ED today with complaint of loss of consciousness. Pt reports that he passed out twice yesterday.  He states that he only 3 tacos yesterday prior to flying 15 games of basketball in the heat.  He states that during one of the games he felt lightheaded like he was going to pass out.  He called for his friends and told them that he felt like he was going to pass out and they were able to bring him to the ground prior to syncopal episode.  He states he was out for approximately 12 seconds.  He states that he continued to play basketball after this and on his way back to the car he had another feeling of lightheadedness.  He was able to sit on the hood of his car and his friends again brought him to the ground before passing out.  He states that since that time he has vomited 6 times.  He also complains of cramping in his bilateral lower extremities.  He states that he has passed out before but it was a long time ago.  He denies any chest pain prior to syncopal episodes and no chest pain during playing basketball.  He denies any family history of sudden cardiac death.  No other complaints at this time.   The history is provided by the patient and medical records.      History reviewed. No pertinent past medical history.  There are no problems to display for this patient.   Past Surgical History:  Procedure Laterality Date   MANDIBLE FRACTURE SURGERY     SHOULDER SURGERY         Family History  Problem Relation Age of Onset   Healthy Mother    Healthy Father     Social History   Tobacco Use   Smoking status: Some Days    Types: Cigars   Smokeless tobacco: Never  Vaping Use   Vaping Use: Never used  Substance Use Topics   Alcohol  use: Yes    Comment: every weekend   Drug use: Yes    Types: Marijuana    Comment: every day    Home Medications Prior to Admission medications   Medication Sig Start Date End Date Taking? Authorizing Provider  potassium chloride SA (K-DUR) 20 MEQ tablet Take 1 tablet (20 mEq total) by mouth 2 (two) times daily. 12/26/18 04/17/19  Ward, Layla Maw, DO    Allergies    Patient has no known allergies.  Review of Systems   Review of Systems  Constitutional:  Negative for chills and fever.  Eyes:  Negative for visual disturbance.  Cardiovascular:  Negative for chest pain.  Gastrointestinal:  Positive for nausea and vomiting.  Neurological:  Positive for syncope. Negative for headaches.  All other systems reviewed and are negative.  Physical Exam Updated Vital Signs BP (!) 147/78 (BP Location: Right Arm)   Pulse 85   Temp 99.1 F (37.3 C) (Oral)   Resp 20   Ht 6' (1.829 m)   Wt 95.7 kg   SpO2 100%   BMI 28.62 kg/m   Physical Exam Vitals and nursing note reviewed.  Constitutional:      Appearance: He is not ill-appearing or  diaphoretic.  HENT:     Head: Normocephalic and atraumatic.     Mouth/Throat:     Mouth: Mucous membranes are dry.  Eyes:     Conjunctiva/sclera: Conjunctivae normal.  Cardiovascular:     Rate and Rhythm: Normal rate and regular rhythm.     Pulses: Normal pulses.  Pulmonary:     Effort: Pulmonary effort is normal.     Breath sounds: Normal breath sounds. No wheezing, rhonchi or rales.  Abdominal:     Palpations: Abdomen is soft.     Tenderness: There is no abdominal tenderness. There is no guarding or rebound.  Musculoskeletal:     Cervical back: Neck supple.  Skin:    General: Skin is warm and dry.  Neurological:     Mental Status: He is alert.    ED Results / Procedures / Treatments   Labs (all labs ordered are listed, but only abnormal results are displayed) Labs Reviewed  CBC WITH DIFFERENTIAL/PLATELET - Abnormal; Notable for the  following components:      Result Value   WBC 15.3 (*)    Neutro Abs 12.3 (*)    Monocytes Absolute 1.2 (*)    All other components within normal limits  BASIC METABOLIC PANEL - Abnormal; Notable for the following components:   Potassium 3.2 (*)    Creatinine, Ser 1.56 (*)    Anion gap 16 (*)    All other components within normal limits  URINALYSIS, ROUTINE W REFLEX MICROSCOPIC - Abnormal; Notable for the following components:   APPearance HAZY (*)    Ketones, ur 80 (*)    Protein, ur 30 (*)    Bacteria, UA RARE (*)    All other components within normal limits  CK  ETHANOL    EKG None  Radiology No results found.  Procedures Procedures   Medications Ordered in ED Medications  ondansetron (ZOFRAN-ODT) disintegrating tablet 4 mg (4 mg Oral Given 02/10/21 2218)  sodium chloride 0.9 % bolus 1,000 mL (0 mLs Intravenous Stopped 02/11/21 0943)  potassium chloride (KLOR-CON) packet 40 mEq (40 mEq Oral Given 02/11/21 7510)    ED Course  I have reviewed the triage vital signs and the nursing notes.  Pertinent labs & imaging results that were available during my care of the patient were reviewed by me and considered in my medical decision making (see chart for details).    MDM Rules/Calculators/A&P                           30 year old male who presents to the ED today with complaint of 2 syncopal episodes that occurred yesterday while playing basketball.  On arrival to the ED today patient was afebrile, nontachycardic and nontachypneic.  Blood pressure 142/97.  EKG with normal sinus rhythm with a heart rate of 93.  CBC did show leukocytosis of 15,300 however patient denies any infectious type symptoms.  Repeat leukocytosis a couple of years ago at 12,000.  BMP with a potassium of 3.2 and a creatinine of 1.56.  No other electrolyte abnormalities.  EtOH negative, in triage he stated that he had been drinking alcohol recently as well.  CK3 58.  UA does have 80 ketones consistent with  dehydration.  On my exam patient is resting comfortably, has dry mucous membranes.  His physical exam is unremarkable at this time.  We will plan for orthostatics, fluid rehydration, and oral potassium.    Orthostatic Vital Signs Orthostatic Lying  BP-  Lying: 149/84 Pulse- Lying: 58 Orthostatic Sitting BP- Sitting: 155/80 Pulse- Sitting: 64 Orthostatic Standing at 0 minutes BP- Standing at 0 minutes: 185/110 (!) Pulse- Standing at 0 minutes: 78   On reevaluation pt resting comfortably. He has been able to tolerate PO in the ED without issue. He states his cramping has resolved and he feels improved. Will plan to discharge home at this time. Pt encouraged to increase oral intake and to stay hydrated/eat properly before exercise. He is encouraged to follow up with PCP/have potassium rechecked in 1-2 weeks. He is in agreement with plan and stable for discharge home.   This note was prepared using Dragon voice recognition software and may include unintentional dictation errors due to the inherent limitations of voice recognition software.  Final Clinical Impression(s) / ED Diagnoses Final diagnoses:  Hypokalemia  Dehydration  Near syncope     Discharge Instructions      Please follow up with [facility] for primary care needs. You will need to have your potassium level rechecked in 1-2 weeks given your potassium level was slightly low today. Increase the amount of potassium in your diet over the next 1-2 weeks.   Drink plenty of water to stay hydrated and eat 3 square meals a day especially prior to strenuous activity as dehydration can cause you to pass out.   Return to the ED for any new/worsening symptpoms       Rx / DC Orders ED Discharge Orders     None        Tanda Rockers, PA-C 02/11/21 0932    Margarita Grizzle, MD 02/11/21 [phone]

## 2021-02-11 NOTE — Discharge Instructions (Addendum)
Please follow up with [facility] for primary care needs. You will need to have your potassium level rechecked in 1-2 weeks given your potassium level was slightly low today. Increase the amount of potassium in your diet over the next 1-2 weeks.   Drink plenty of water to stay hydrated and eat 3 square meals a day especially prior to strenuous activity as dehydration can cause you to pass out.   Return to the ED for any new/worsening symptpoms

## 2021-03-19 ENCOUNTER — Emergency Department (HOSPITAL_COMMUNITY)
Admission: EM | Admit: 2021-03-19 | Discharge: 2021-03-19 | Disposition: A | Discharge status: Home / Self Care | Payer: Self-pay | Attending: Emergency Medicine | Admitting: Emergency Medicine

## 2021-03-19 ENCOUNTER — Other Ambulatory Visit: Payer: Self-pay

## 2021-03-19 ENCOUNTER — Emergency Department (HOSPITAL_COMMUNITY): Payer: Self-pay

## 2021-03-19 ENCOUNTER — Encounter (HOSPITAL_COMMUNITY): Payer: Self-pay | Admitting: Emergency Medicine

## 2021-03-19 DIAGNOSIS — R109 Unspecified abdominal pain: Secondary | ICD-10-CM | POA: Insufficient documentation

## 2021-03-19 DIAGNOSIS — Z5321 Procedure and treatment not carried out due to patient leaving prior to being seen by health care provider: Secondary | ICD-10-CM | POA: Insufficient documentation

## 2021-03-19 DIAGNOSIS — R0789 Other chest pain: Secondary | ICD-10-CM | POA: Insufficient documentation

## 2021-03-19 DIAGNOSIS — R55 Syncope and collapse: Secondary | ICD-10-CM | POA: Insufficient documentation

## 2021-03-19 DIAGNOSIS — R1110 Vomiting, unspecified: Secondary | ICD-10-CM | POA: Insufficient documentation

## 2021-03-19 LAB — CBC WITH DIFFERENTIAL/PLATELET
Abs Immature Granulocytes: 0.05 10*3/uL (ref 0.00–0.07)
Basophils Absolute: 0 10*3/uL (ref 0.0–0.1)
Basophils Relative: 0 %
Eosinophils Absolute: 0 10*3/uL (ref 0.0–0.5)
Eosinophils Relative: 0 %
HCT: 43.1 % (ref 39.0–52.0)
Hemoglobin: 14.4 g/dL (ref 13.0–17.0)
Immature Granulocytes: 0 %
Lymphocytes Relative: 8 %
Lymphs Abs: 1.2 10*3/uL (ref 0.7–4.0)
MCH: 31.9 pg (ref 26.0–34.0)
MCHC: 33.4 g/dL (ref 30.0–36.0)
MCV: 95.6 fL (ref 80.0–100.0)
Monocytes Absolute: 1.1 10*3/uL — ABNORMAL HIGH (ref 0.1–1.0)
Monocytes Relative: 8 %
Neutro Abs: 12.1 10*3/uL — ABNORMAL HIGH (ref 1.7–7.7)
Neutrophils Relative %: 84 %
Platelets: 231 10*3/uL (ref 150–400)
RBC: 4.51 MIL/uL (ref 4.22–5.81)
RDW: 13.3 % (ref 11.5–15.5)
WBC: 14.4 10*3/uL — ABNORMAL HIGH (ref 4.0–10.5)
nRBC: 0 % (ref 0.0–0.2)

## 2021-03-19 LAB — COMPREHENSIVE METABOLIC PANEL
ALT: 39 U/L (ref 0–44)
AST: 58 U/L — ABNORMAL HIGH (ref 15–41)
Albumin: 4.5 g/dL (ref 3.5–5.0)
Alkaline Phosphatase: 74 U/L (ref 38–126)
Anion gap: 15 (ref 5–15)
BUN: 8 mg/dL (ref 6–20)
CO2: 20 mmol/L — ABNORMAL LOW (ref 22–32)
Calcium: 9.5 mg/dL (ref 8.9–10.3)
Chloride: 104 mmol/L (ref 98–111)
Creatinine, Ser: 1.07 mg/dL (ref 0.61–1.24)
GFR, Estimated: >60 mL/min (ref >60)
Glucose, Bld: 82 mg/dL (ref 70–99)
Potassium: 3.5 mmol/L (ref 3.5–5.1)
Sodium: 139 mmol/L (ref 135–145)
Total Bilirubin: 1.2 mg/dL (ref 0.3–1.2)
Total Protein: 7.9 g/dL (ref 6.5–8.1)

## 2021-03-19 LAB — URINALYSIS, ROUTINE W REFLEX MICROSCOPIC
Bilirubin Urine: NEGATIVE
Glucose, UA: NEGATIVE mg/dL
Hgb urine dipstick: NEGATIVE
Ketones, ur: >80 mg/dL — AB
Leukocytes,Ua: NEGATIVE
Nitrite: NEGATIVE
Protein, ur: NEGATIVE mg/dL
Specific Gravity, Urine: 1.025 (ref 1.005–1.030)
pH: 6 (ref 5.0–8.0)

## 2021-03-19 LAB — TROPONIN I (HIGH SENSITIVITY): Troponin I (High Sensitivity): 11 ng/L (ref <18)

## 2021-03-19 LAB — CBG MONITORING, ED: Glucose-Capillary: 93 mg/dL (ref 70–99)

## 2021-03-19 LAB — CK: Total CK: 404 U/L — ABNORMAL HIGH (ref 49–397)

## 2021-03-19 NOTE — ED Notes (Signed)
No response x3 

## 2021-03-19 NOTE — ED Notes (Signed)
No response

## 2021-03-19 NOTE — ED Provider Notes (Signed)
Emergency Medicine Provider Triage Evaluation Note  Brent Buckley , a 30 y.o. male  was evaluated in triage.  Pt complains of six vomiting episodes followed by a syncopal episode after playing basketball approximately two hours pta. The patient has been seen for these symptoms previously on 02/10/21 and diagnosed with hypokalemia and dehydration. He reports feeling SOB with lower substernal chest pain. He mentions he has only had sips of water and had chicken fried rice before playing. Denies any abdominal pain, fevers, or change in bowels. Denies any sudden cardiac death in family.    Review of Systems  Positive: Syncope, vomiting, SOB, chest pain Negative: Abdominal pain, fevers, diarrhea, constipation    Physical Exam  BP (!) 156/81 (BP Location: Right Arm)   Pulse 85   Temp 98.7 F (37.1 C) (Oral)   Resp 17   Ht 6' (1.829 m)   Wt 97.1 kg   SpO2 99%   BMI 29.02 kg/m  Gen:   Awake, no distress Resp:  Normal effort  MSK:   Moves extremities without difficulty  Other:  RRR  Medical Decision Making  Medically screening exam initiated at 5:10 PM.  Appropriate orders placed.  Brent Buckley was informed that the remainder of the evaluation will be completed by another provider, this initial triage assessment does not replace that evaluation, and the importance of remaining in the ED until their evaluation is complete.  Patient is stable for continuation of care by another provider.    Brent Rich, PA-C 03/19/21 1714    Brent Hong, MD 03/23/21 [phone]

## 2021-03-19 NOTE — ED Triage Notes (Signed)
PT reports syncope after playing basketball today. Pt vomited. Pt having chest/abdominal dsicomfort. VSS. Nad at present.

## 2021-03-22 ENCOUNTER — Other Ambulatory Visit: Payer: Self-pay

## 2021-03-22 ENCOUNTER — Emergency Department (HOSPITAL_COMMUNITY): Payer: Medicaid Other

## 2021-03-22 ENCOUNTER — Emergency Department (HOSPITAL_COMMUNITY)
Admission: EM | Admit: 2021-03-22 | Discharge: 2021-03-23 | Disposition: A | Discharge status: Home / Self Care | Payer: Medicaid Other | Attending: Emergency Medicine | Admitting: Emergency Medicine

## 2021-03-22 DIAGNOSIS — R079 Chest pain, unspecified: Secondary | ICD-10-CM | POA: Insufficient documentation

## 2021-03-22 DIAGNOSIS — R112 Nausea with vomiting, unspecified: Secondary | ICD-10-CM | POA: Insufficient documentation

## 2021-03-22 DIAGNOSIS — R1013 Epigastric pain: Secondary | ICD-10-CM | POA: Insufficient documentation

## 2021-03-22 DIAGNOSIS — Z5321 Procedure and treatment not carried out due to patient leaving prior to being seen by health care provider: Secondary | ICD-10-CM | POA: Insufficient documentation

## 2021-03-22 LAB — BASIC METABOLIC PANEL
Anion gap: 9 (ref 5–15)
BUN: 10 mg/dL (ref 6–20)
CO2: 26 mmol/L (ref 22–32)
Calcium: 9.4 mg/dL (ref 8.9–10.3)
Chloride: 103 mmol/L (ref 98–111)
Creatinine, Ser: 1.17 mg/dL (ref 0.61–1.24)
GFR, Estimated: >60 mL/min (ref >60)
Glucose, Bld: 113 mg/dL — ABNORMAL HIGH (ref 70–99)
Potassium: 3.7 mmol/L (ref 3.5–5.1)
Sodium: 138 mmol/L (ref 135–145)

## 2021-03-22 LAB — CBC
HCT: 42.9 % (ref 39.0–52.0)
Hemoglobin: 14.5 g/dL (ref 13.0–17.0)
MCH: 32.4 pg (ref 26.0–34.0)
MCHC: 33.8 g/dL (ref 30.0–36.0)
MCV: 95.8 fL (ref 80.0–100.0)
Platelets: 196 10*3/uL (ref 150–400)
RBC: 4.48 MIL/uL (ref 4.22–5.81)
RDW: 12.9 % (ref 11.5–15.5)
WBC: 9.3 10*3/uL (ref 4.0–10.5)
nRBC: 0 % (ref 0.0–0.2)

## 2021-03-22 LAB — TROPONIN I (HIGH SENSITIVITY)
Troponin I (High Sensitivity): 3 ng/L (ref <18)
Troponin I (High Sensitivity): 3 ng/L (ref <18)

## 2021-03-22 MED ORDER — ACETAMINOPHEN 325 MG PO TABS
650.0000 mg | ORAL_TABLET | Freq: Once | ORAL | Status: AC
  Administered 2021-03-22: 650 mg via ORAL
  Filled 2021-03-22: qty 2

## 2021-03-22 NOTE — ED Triage Notes (Signed)
Pt reported feeling nausea and hyperemesis out of nowhere and passed out. Patient endorsing CP and epigastric pain. Patient not doing anything prior to passing out. Pt stated that he blacked for about 12 seconds. Pt states pain is tight and sharp.

## 2021-03-23 NOTE — ED Notes (Signed)
Pt called 3x no answer  

## 2022-07-27 IMAGING — DX DG CHEST 2V
2 series · 2 of 2 positions shown · non-contrast
Comparison: 12/25/2018

CLINICAL DATA: Shortness of breath.  Near syncope.

EXAM:
CHEST - 2 VIEW

[chest lat]
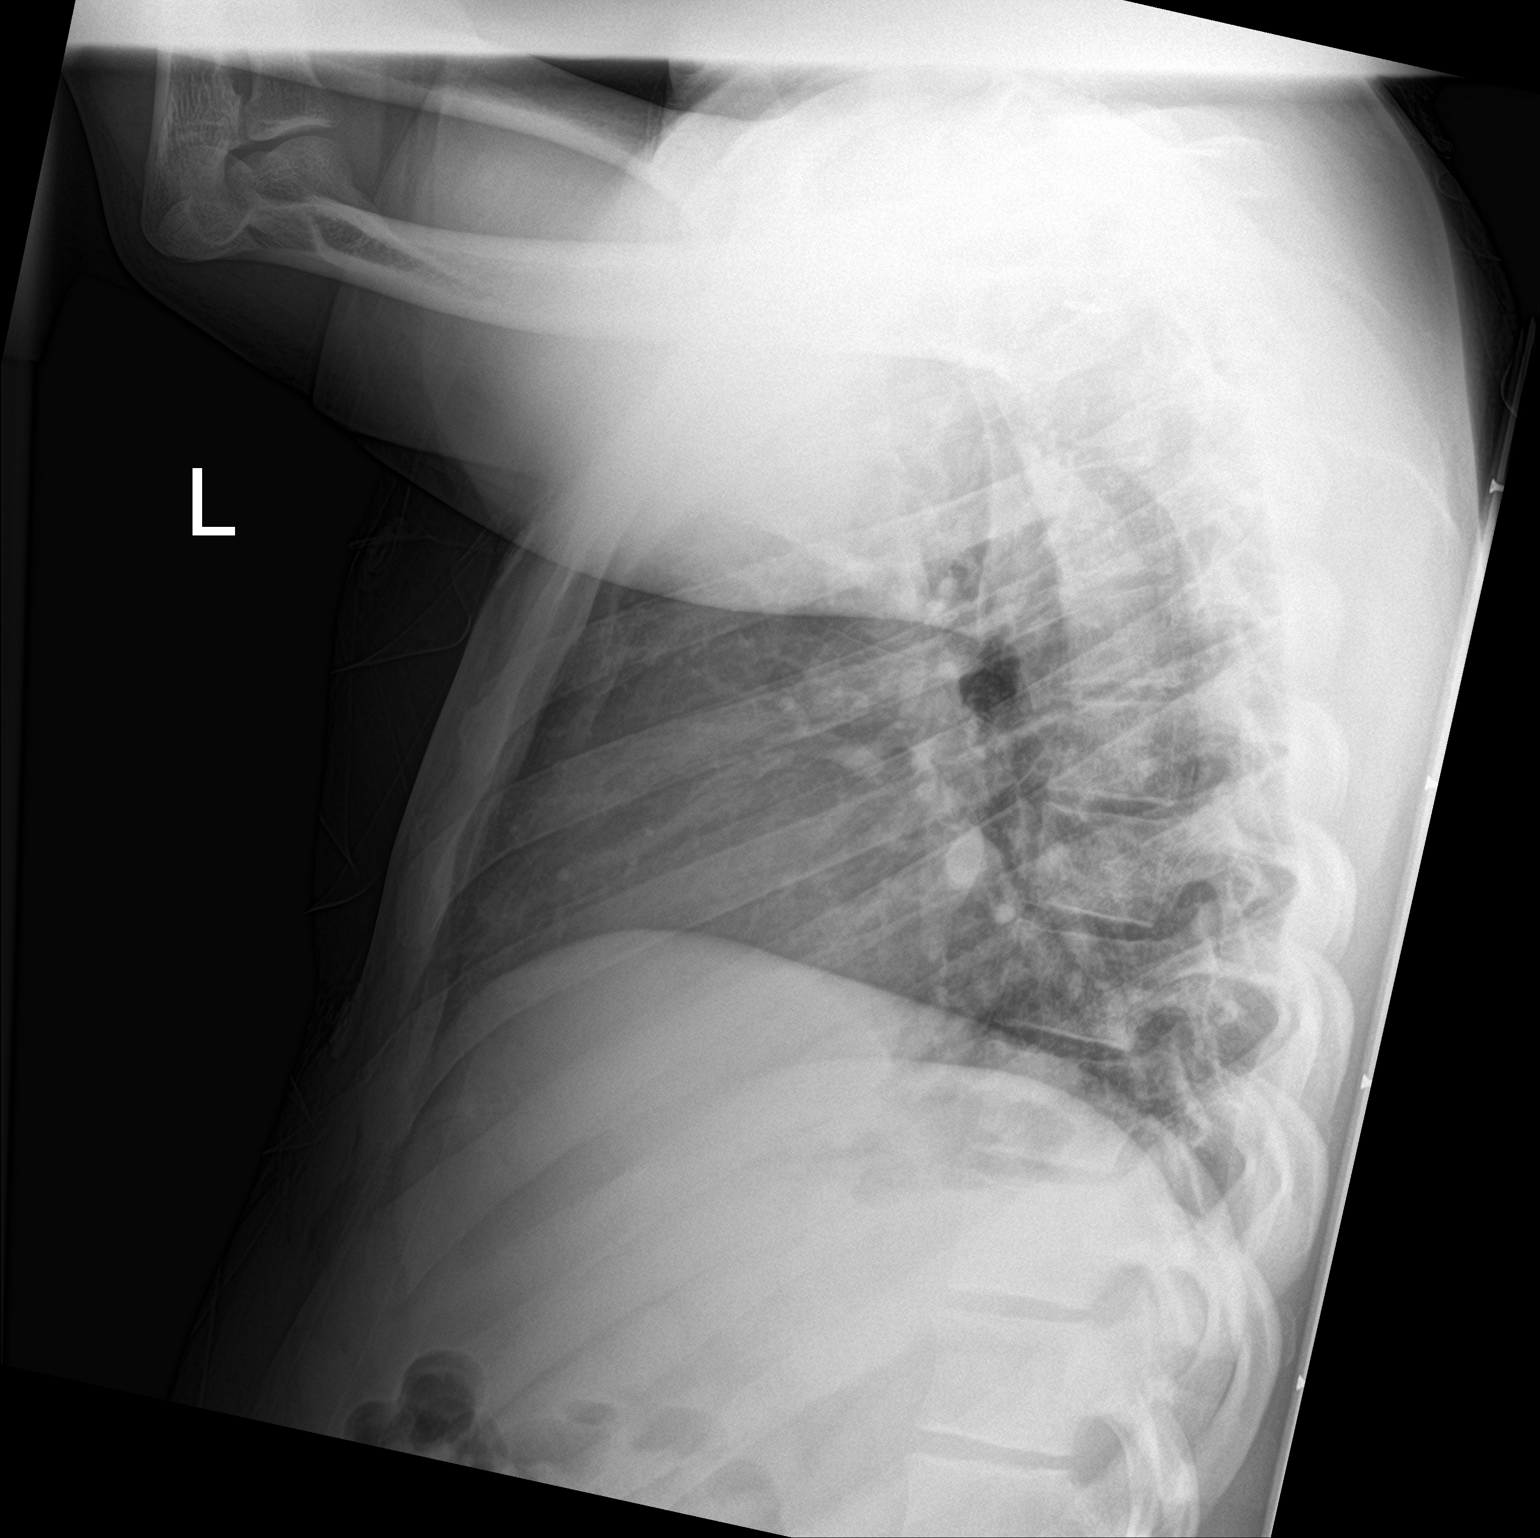

[chest ap]
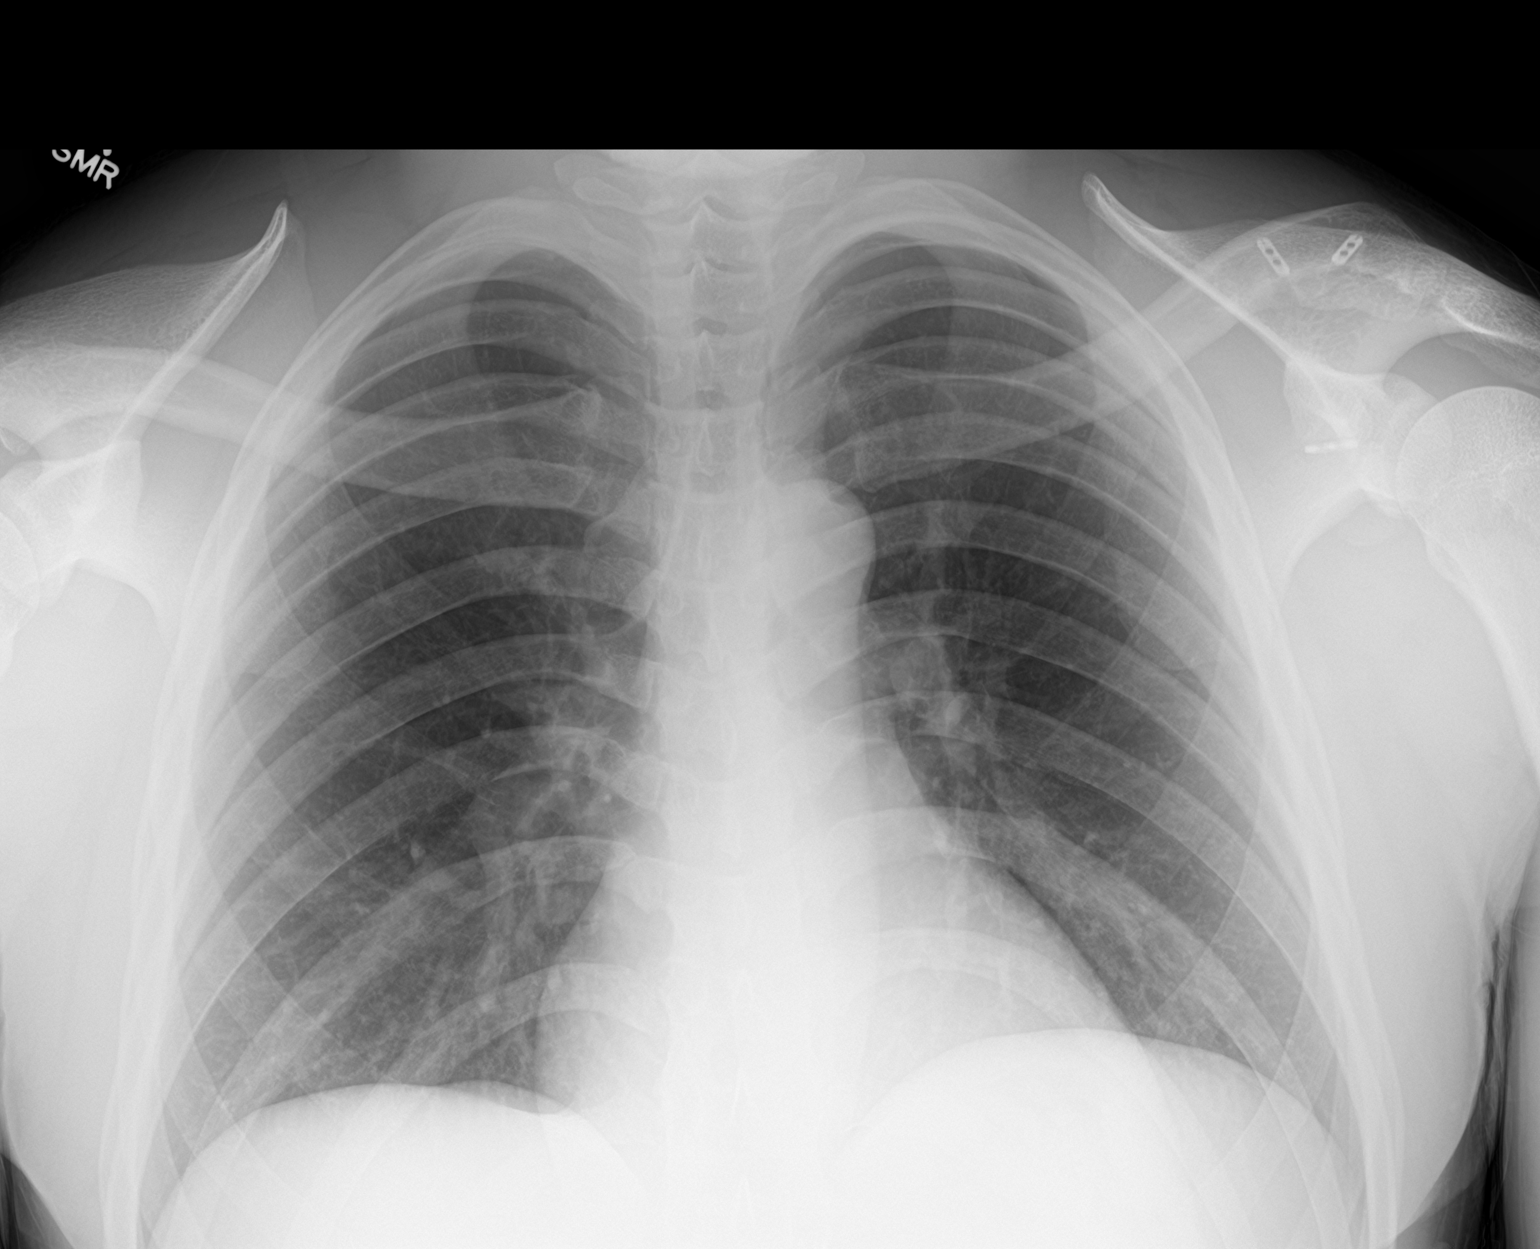

[2 of 2 positions shown; findings below may reference images not displayed]

FINDINGS: The cardiomediastinal contours are normal. Minor bibasilar
atelectasis without confluent consolidation. Pulmonary vasculature
is normal. No pleural effusion or pneumothorax. No acute osseous
abnormalities are seen.
IMPRESSION: Minor bibasilar atelectasis.

## 2022-07-30 IMAGING — CR DG CHEST 2V
2 series · 2 of 2 positions shown · non-contrast
Comparison: March 19, 2021

CLINICAL DATA: Chest Pain

EXAM:
CHEST - 2 VIEW

[chest pa]
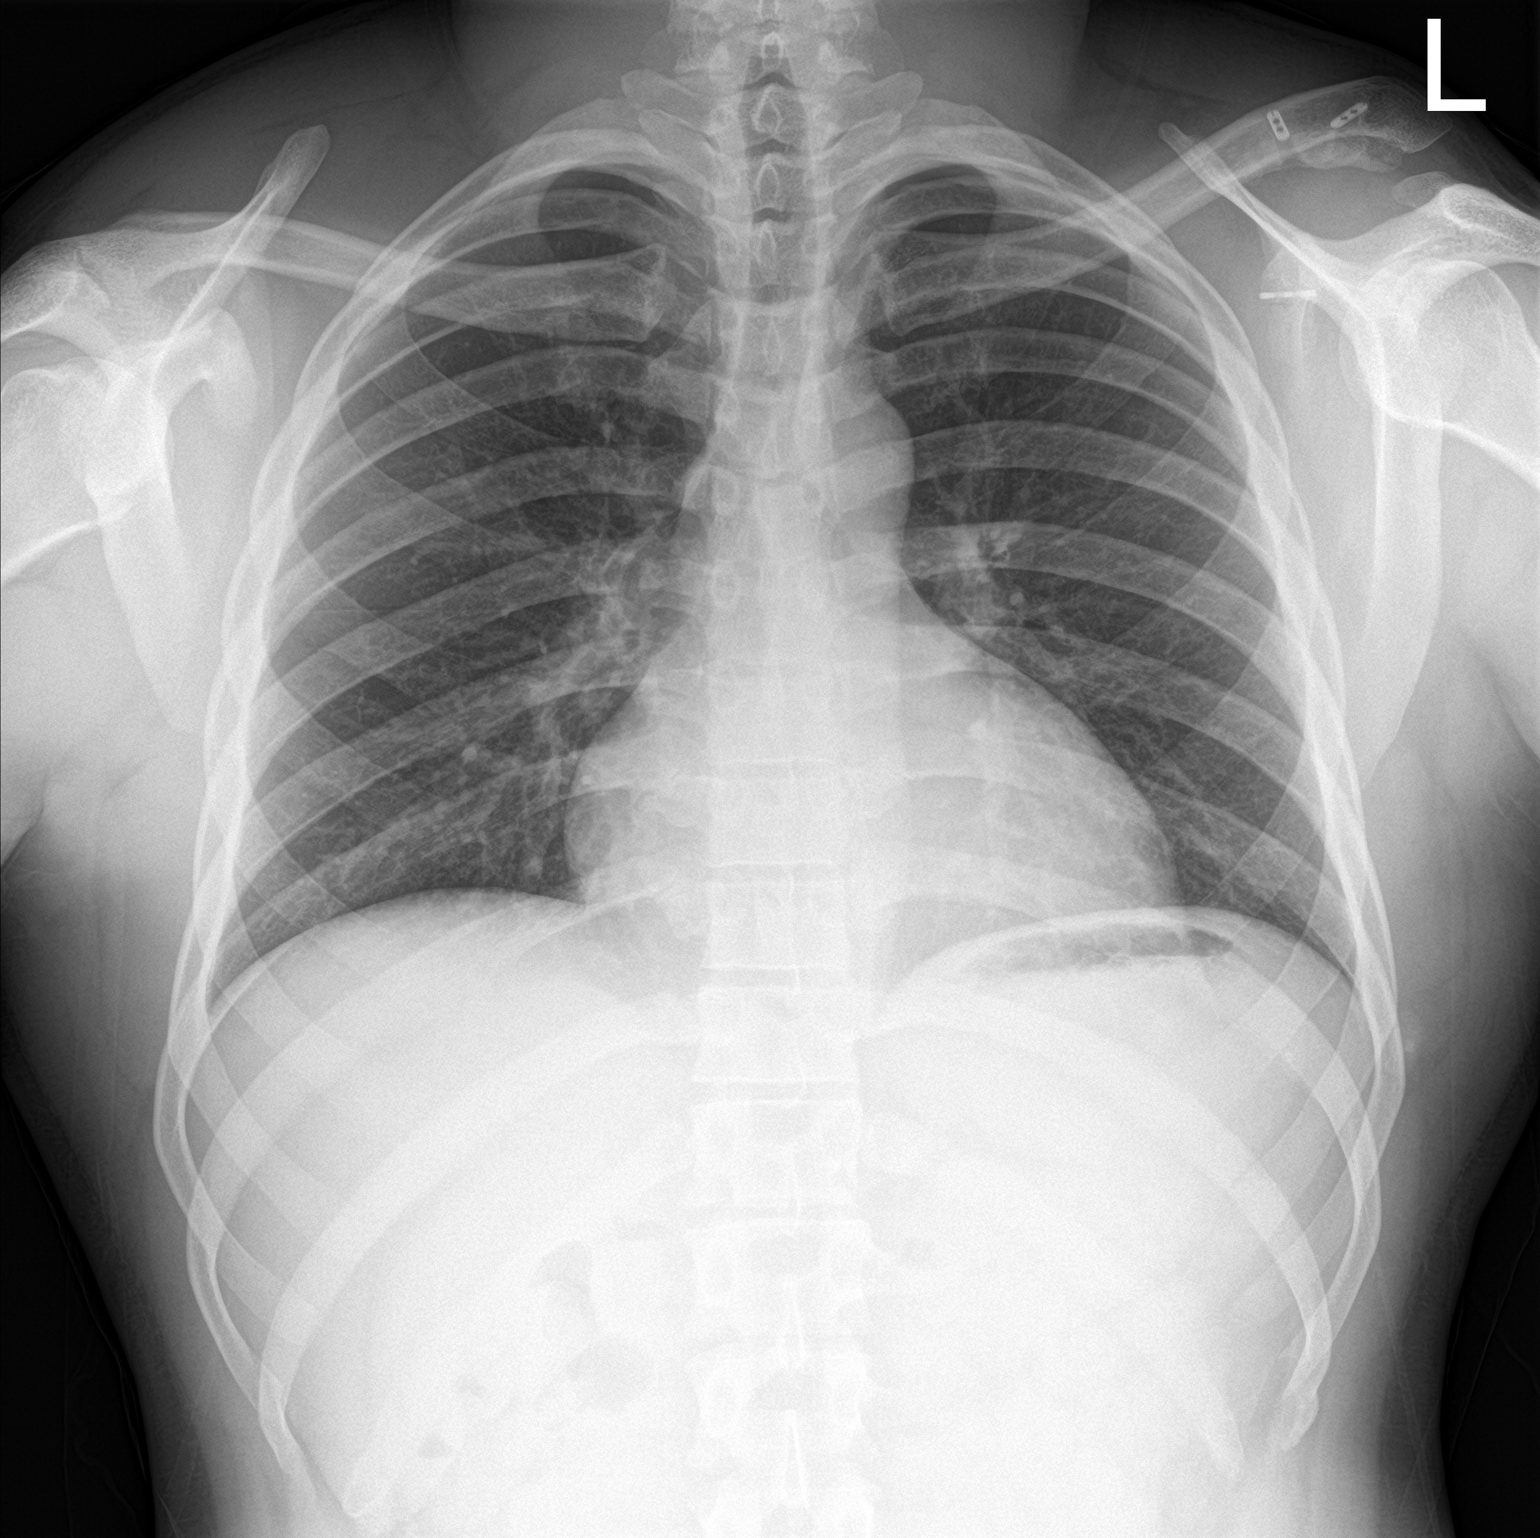

[chest lat]
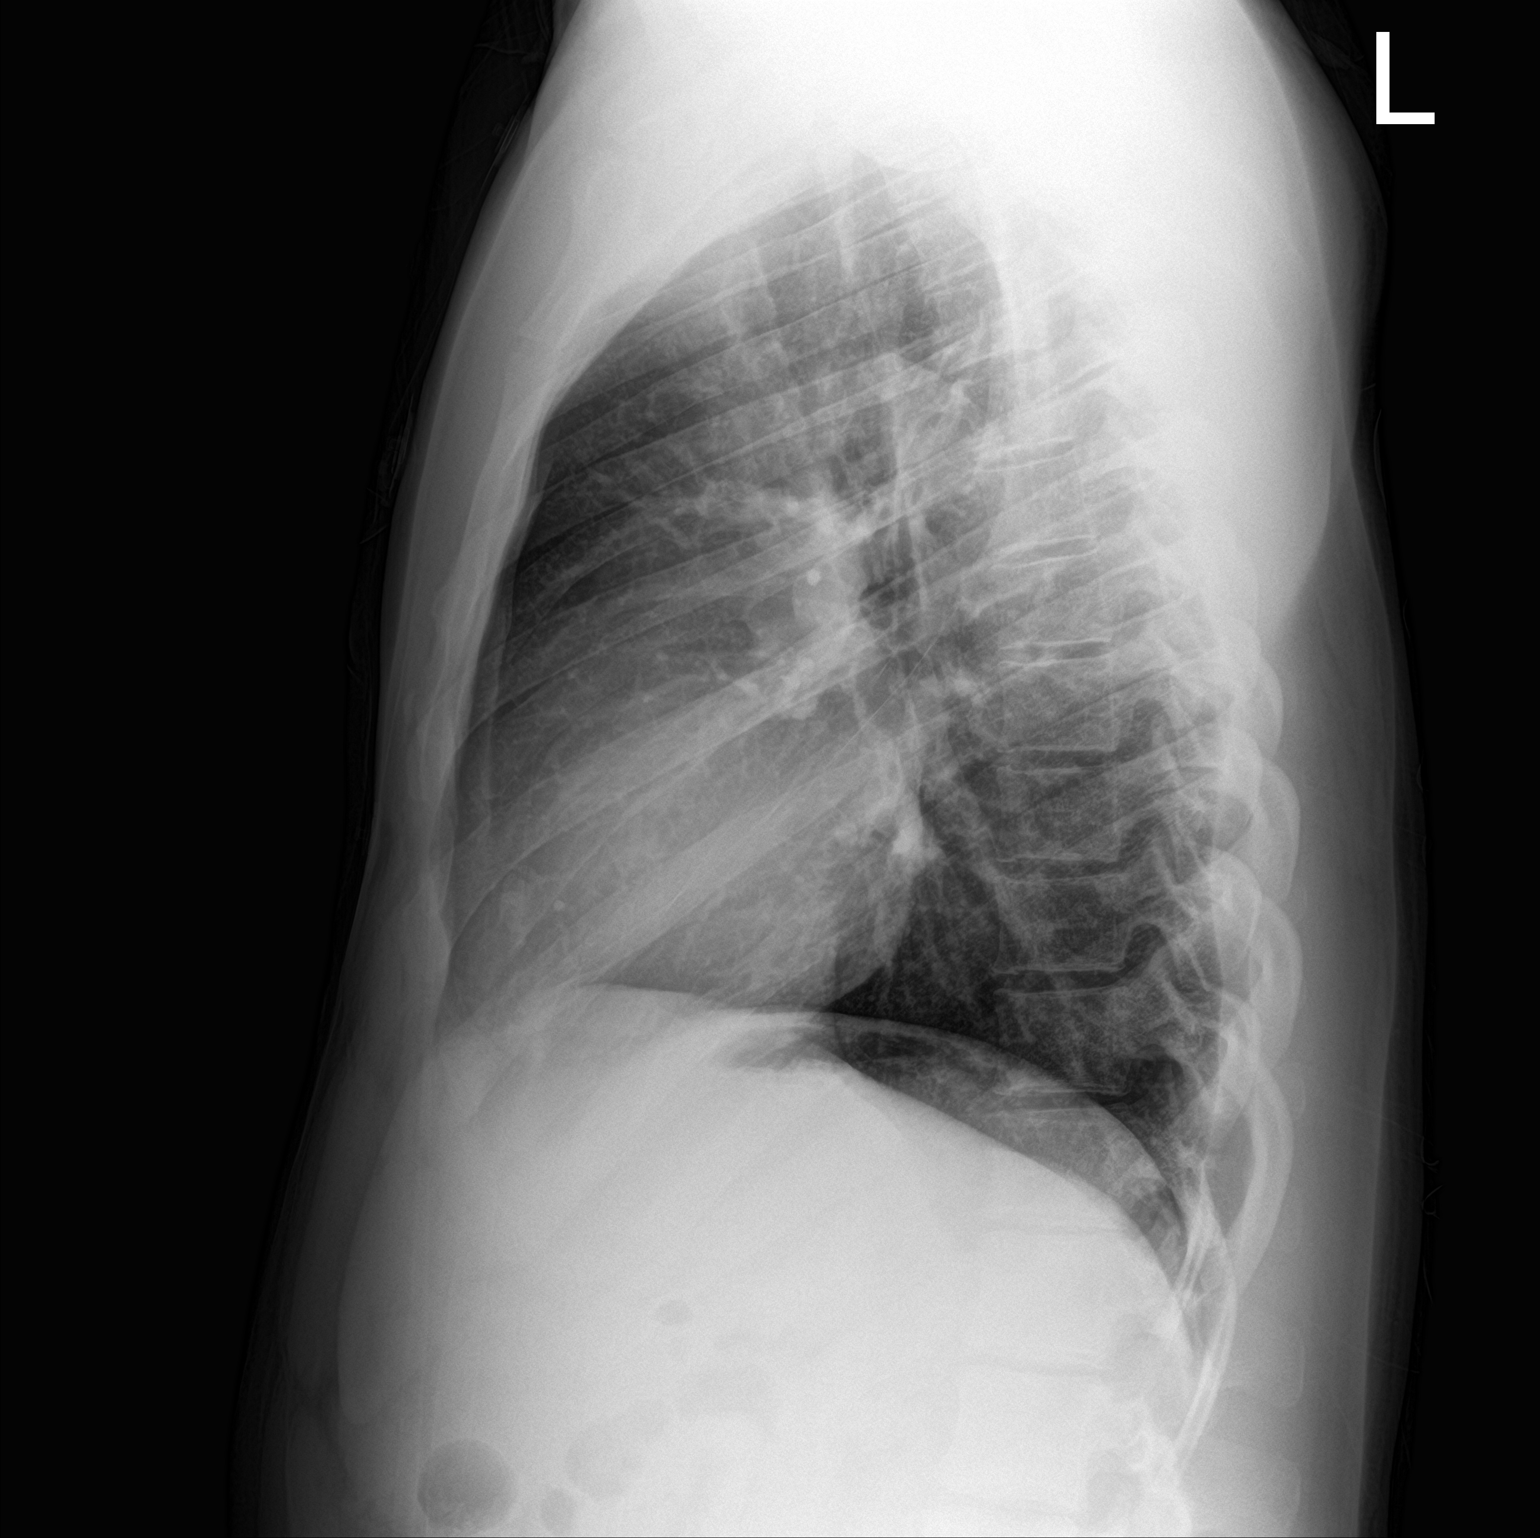

[2 of 2 positions shown; findings below may reference images not displayed]

FINDINGS: The cardiomediastinal silhouette is normal in contour. No pleural
effusion. No pneumothorax. No acute pleuroparenchymal abnormality.
Visualized abdomen is unremarkable. Status post LEFT shoulder
surgery.
IMPRESSION: No acute cardiopulmonary abnormality.
